# Patient Record
Sex: Male | Born: 1990 | Hispanic: Yes | Marital: Single | Smoking: Never smoker
Health system: Southern US, Community
[De-identification: ages and names within clinical notes are randomized; demographics above are authoritative.]

---
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
## 2011-04-28 ENCOUNTER — Other Ambulatory Visit (HOSPITAL_COMMUNITY): Payer: Self-pay

## 2011-04-28 ENCOUNTER — Emergency Department (HOSPITAL_COMMUNITY)
Admission: EM | Admit: 2011-04-28 | Discharge: 2011-04-28 | Disposition: A | Discharge status: Home / Self Care | Payer: Worker's Compensation | Attending: Emergency Medicine | Admitting: Emergency Medicine

## 2011-04-28 ENCOUNTER — Encounter: Payer: Self-pay | Admitting: *Deleted

## 2011-04-28 ENCOUNTER — Emergency Department (HOSPITAL_COMMUNITY): Payer: Worker's Compensation

## 2011-04-28 DIAGNOSIS — M25569 Pain in unspecified knee: Secondary | ICD-10-CM | POA: Insufficient documentation

## 2011-04-28 DIAGNOSIS — S76319A Strain of muscle, fascia and tendon of the posterior muscle group at thigh level, unspecified thigh, initial encounter: Secondary | ICD-10-CM

## 2011-04-28 DIAGNOSIS — X500XXA Overexertion from strenuous movement or load, initial encounter: Secondary | ICD-10-CM | POA: Insufficient documentation

## 2011-04-28 DIAGNOSIS — Y9269 Other specified industrial and construction area as the place of occurrence of the external cause: Secondary | ICD-10-CM | POA: Insufficient documentation

## 2011-04-28 DIAGNOSIS — IMO0002 Reserved for concepts with insufficient information to code with codable children: Secondary | ICD-10-CM | POA: Insufficient documentation

## 2011-04-28 DIAGNOSIS — Y990 Civilian activity done for income or pay: Secondary | ICD-10-CM | POA: Insufficient documentation

## 2011-04-28 MED ORDER — TRAMADOL-ACETAMINOPHEN 37.5-325 MG PO TABS: ORAL_TABLET | ORAL | Status: AC

## 2011-04-28 MED ORDER — IBUPROFEN 200 MG PO TABS
600.0000 mg | ORAL_TABLET | Freq: Once | ORAL | Status: AC
  Administered 2011-04-28: 600 mg via ORAL
  Filled 2011-04-28: qty 3

## 2011-04-28 NOTE — Discharge Instructions (Signed)
Elevate your leg. Useice packs where the pain is located.You can take ibuprofen 600 mg every 6 hours for pain. You can take it with Ultracet. Call Dr Celene Skeen office or your company's workman's comp physician to get an appointment to recheck your leg next week. Wear the knee immobilizer to stabilize your leg, you can take it off to shower.

## 2011-04-28 NOTE — ED Notes (Signed)
Pt states "I work @ [employer], was moving some tables & it hit my leg and I just felt something, I can't really move it"; pt indicates pain in right knee area

## 2011-04-28 NOTE — ED Provider Notes (Signed)
History     CSN: [id number] Arrival date & time: 04/28/2011  6:35 PM   First MD Initiated Contact with Patient 04/28/11 1857      Chief Complaint  Patient presents with  . 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 He relates they were moving tables in the kidneys right 5 and jumped back to get out of the way. He states he then started having pain behind his right knee. He states he has pain when he tries to flex his knee. He states he's walking with a limp. This happened this afternoon just prior to coming to the emergency department. He denies any prior problems with his knee. He points to the area behind his knee in the popliteal area as to where his pain is.  Primary care physician none  History reviewed. No pertinent past medical history.  History reviewed. No pertinent past surgical history.  No family history on file.  History  Substance Use Topics  . Smoking status: Never Smoker   . Smokeless tobacco: Not on file  . Alcohol Use: No   employed    Review of Systems  All other systems reviewed and are negative.    Allergies  Review of patient's allergies indicates no known allergies.  Home Medications   Current Outpatient Rx  Name Route Sig Dispense Refill  . IBUPROFEN 200 MG PO TABS Oral Take 200 mg by mouth every 6 (six) hours as needed. For headache       BP 121/72  Pulse 113  Temp(Src) 97.5 F (36.4 C) (Oral)  Resp 16  Wt 138 lb (62.596 kg)  SpO2 100%  Vital signs show tachycardia otherwise normal  Physical Exam  Nursing note and vitals reviewed. Constitutional: He is oriented to person, place, and time. He appears well-developed and well-nourished.  Non-toxic appearance. He does not appear ill. No distress.  HENT:  Head: Normocephalic and atraumatic.  Right Ear: External ear normal.  Left Ear: External ear normal.    Nose: Nose normal. No mucosal edema or rhinorrhea.  Mouth/Throat: Mucous membranes are normal. No dental abscesses or uvula swelling.  Eyes: Conjunctivae and EOM are normal. Pupils are equal, round, and reactive to light.  Neck: Full passive range of motion without pain.  Pulmonary/Chest: Effort normal. He has no rhonchi. He exhibits no crepitus.  Abdominal: Normal appearance.  Musculoskeletal: Normal range of motion. He exhibits no edema and no tenderness.       Was noted to have a very small effusion of his right knee. He is very tender to palpation at the insertion of the hamstring muscle that seems to reproduce his complaints of pain in the popliteal area. The area of the location of the Baker's cyst is nontender to palpation there is no fullness in that area. He has discomfort on flexion obesity and indicates the same area where the hamstring inserts.  Neurological: He is alert and oriented to person, place, and time. He has normal strength. No cranial nerve deficit.  Skin: Skin is warm, dry and intact. No rash noted. No erythema. No pallor.  Psychiatric: He has a normal mood and affect. His speech is normal and behavior is normal. His mood appears not anxious.    ED Course  Procedures (including critical care time)  Pt placed in knee immobilizer and given ibuprofen for pain.   Dg Knee Complete 4 Views Right  04/28/2011  *RADIOLOGY REPORT*  Clinical  Data: Twisted knee  RIGHT KNEE - COMPLETE 4+ VIEW  Comparison: None  Findings: There is no evidence of fracture or dislocation.  There is no evidence of arthropathy or other focal bone abnormality. Soft tissues are unremarkable.  IMPRESSION: No acute findings.  Original Report Authenticated By: Rosealee Albee, M.D.    Diagnoses that have been ruled out:  Diagnoses that are still under consideration:  Final diagnoses:  Hamstring strain    New Prescriptions   TRAMADOL-ACETAMINOPHEN (ULTRACET) 37.5-325 MG PER TABLET    2 tabs po QID  prn pain   Plan discharge with ortho referral  Devoria Albe, MD, Armando Gang   MDM          Ward Givens, MD 04/28/11 2145

## 2011-04-28 NOTE — ED Notes (Signed)
Knee immobilizer applied by ortho tech 

## 2014-01-21 ENCOUNTER — Ambulatory Visit (INDEPENDENT_AMBULATORY_CARE_PROVIDER_SITE_OTHER): Payer: No Typology Code available for payment source | Admitting: Family Medicine

## 2014-01-21 VITALS — BP 128/78 | HR 86 | Temp 98.0°F | Resp 16 | Ht 66.5 in | Wt 160.0 lb

## 2014-01-21 DIAGNOSIS — J029 Acute pharyngitis, unspecified: Secondary | ICD-10-CM

## 2014-01-21 DIAGNOSIS — R112 Nausea with vomiting, unspecified: Secondary | ICD-10-CM

## 2014-01-21 DIAGNOSIS — R509 Fever, unspecified: Secondary | ICD-10-CM

## 2014-01-21 LAB — POCT CBC
Granulocyte percent: 70.7 %G (ref 37–80)
HCT, POC: 46.1 % (ref 43.5–53.7)
Hemoglobin: 15.4 g/dL (ref 14.1–18.1)
Lymph, poc: 1.6 (ref 0.6–3.4)
MCH, POC: 28.8 pg (ref 27–31.2)
MCHC: 33.4 g/dL (ref 31.8–35.4)
MCV: 86.3 fL (ref 80–97)
MID (CBC): 0.6 (ref 0–0.9)
MPV: 8.5 fL (ref 0–99.8)
POC Granulocyte: 5.4 (ref 2–6.9)
POC LYMPH PERCENT: 21.2 %L (ref 10–50)
POC MID %: 8.1 %M (ref 0–12)
Platelet Count, POC: 193 10*3/uL (ref 142–424)
RBC: 5.34 M/uL (ref 4.69–6.13)
RDW, POC: 12.9 %
WBC: 7.7 10*3/uL (ref 4.6–10.2)

## 2014-01-21 LAB — POCT RAPID STREP A (OFFICE): RAPID STREP A SCREEN: NEGATIVE

## 2014-01-21 MED ORDER — ONDANSETRON HCL 8 MG PO TABS: 8.0000 mg | ORAL_TABLET | Freq: Three times a day (TID) | ORAL | Status: DC | PRN

## 2014-01-21 NOTE — Progress Notes (Signed)
[facility] [address], [place] [address] [phone]  Date:  01/21/2014   Name:  Ricky Gonzales   DOB:  11-24-1990   MRN:  [id number]  PCP:  No PCP Per Patient    Chief Complaint: Headache and Nasal Congestion   History of Present Illness:  Ricky Gonzales is a 23 y.o. very pleasant male patient who presents with the following:  He has noted a HA for a couple of days.  Last night he noted a worse headache, temp of 101 and vomiting.  He also notes a ST which is a bit better now compared with yesterday.  He also has a dry cough.  He has noted a runny nose and sneezing.   He vomited 4x, no diarrhea.  He is nauseated now but no vomiting today.  He can feel his abdomen "bubbling" but does not have abdominal pain  He ate a little fruit and drank fluids today with success.     He is generally in good healthy.    He last took advil last night.  None for over 12 hours now  There are no active problems to display for this patient.   History reviewed. No pertinent past medical history.  History reviewed. No pertinent past surgical history.  History  Substance Use Topics  . Smoking status: Never Smoker   . Smokeless tobacco: Not on file  . Alcohol Use: Yes     Comment: socially    Family History  Problem Relation Age of Onset  . Diabetes Maternal Uncle   . Diabetes Maternal Grandmother     No Known Allergies  Medication list has been reviewed and updated.  Current Outpatient Prescriptions on File Prior to Visit  Medication Sig Dispense Refill  . ibuprofen (ADVIL,MOTRIN) 200 MG tablet Take 200 mg by mouth every 6 (six) hours as needed. For headache        No current facility-administered medications on file prior to visit.    Review of Systems:  As per HPI- otherwise negative.   Physical Examination: Filed Vitals:   01/21/14 1224  BP: 128/78  Pulse: 86  Temp: 98 F (36.7 C)  Resp: 16   Filed Vitals:   01/21/14 1224   Height: 5' 6.5" (1.689 m)  Weight: 160 lb (72.576 kg)   Body mass index is 25.44 kg/(m^2). Ideal Body Weight: Weight in (lb) to have BMI = 25: 156.9  GEN: WDWN, NAD, Non-toxic, A & O x 3, looks well HEENT: Atraumatic, Normocephalic. Neck supple. No masses, No LAD.  Bilateral TM wnl, oropharynx normal.  PEERL,EOMI.   Ears and Nose: No external deformity. CV: RRR, No M/G/R. No JVD. No thrill. No extra heart sounds. PULM: CTA B, no wheezes, crackles, rhonchi. No retractions. No resp. distress. No accessory muscle use. ABD: S, NT, ND, +BS. No rebound. No HSM. EXTR: No c/c/e NEURO Normal gait.  PSYCH: Normally interactive. Conversant. Not depressed or anxious appearing.  Calm demeanor.   Results for orders placed in visit on 01/21/14  POCT RAPID STREP A (OFFICE)      Result Value Ref Range   Rapid Strep A Screen Negative  Negative  POCT CBC      Result Value Ref Range   WBC 7.7  4.6 - 10.2 K/uL   Lymph, poc 1.6  0.6 - 3.4   POC LYMPH PERCENT 21.2  10 - 50 %L   MID (cbc) 0.6  0 - 0.9   POC  MID % 8.1  0 - 12 %M   POC Granulocyte 5.4  2 - 6.9   Granulocyte percent 70.7  37 - 80 %G   RBC 5.34  4.69 - 6.13 M/uL   Hemoglobin 15.4  14.1 - 18.1 g/dL   HCT, POC 16.1  09.6 - 53.7 %   MCV 86.3  80 - 97 fL   MCH, POC 28.8  27 - 31.2 pg   MCHC 33.4  31.8 - 35.4 g/dL   RDW, POC 04.5     Platelet Count, POC 193  142 - 424 K/uL   MPV 8.5  0 - 99.8 fL    Assessment and Plan: Acute pharyngitis, unspecified pharyngitis type - Plan: POCT rapid strep A, POCT CBC, Culture, Group A Strep  Fever, unspecified - Plan: POCT rapid strep A, Culture, Group A Strep  Non-intractable vomiting with nausea, vomiting of unspecified type - Plan: POCT rapid strep A, ondansetron (ZOFRAN) 8 MG tablet  Likely viral illness.  He seems to be getting better and no longer has a fever.  Will treat with zofran as needed for GI symptoms, close follow-up if not better  Signed Ricky Amsterdam, MD

## 2014-01-21 NOTE — Patient Instructions (Signed)
It appears that you have a viral illness.  Use the zofran as needed for nausea, and advil for fever.  Let me know if you do not feel better in the next 1-2 days- Sooner if worse.   Rest and drink plenty of fluids, eat bland foods like bananas and toast until you are better.

## 2014-01-23 LAB — CULTURE, GROUP A STREP: ORGANISM ID, BACTERIA: NORMAL

## 2014-08-11 ENCOUNTER — Emergency Department (HOSPITAL_COMMUNITY)
Admission: EM | Admit: 2014-08-11 | Discharge: 2014-08-12 | Disposition: A | Discharge status: Home / Self Care | Payer: Worker's Compensation | Attending: Emergency Medicine | Admitting: Emergency Medicine

## 2014-08-11 ENCOUNTER — Encounter (HOSPITAL_COMMUNITY): Payer: Self-pay

## 2014-08-11 DIAGNOSIS — B349 Viral infection, unspecified: Secondary | ICD-10-CM | POA: Insufficient documentation

## 2014-08-11 DIAGNOSIS — K226 Gastro-esophageal laceration-hemorrhage syndrome: Secondary | ICD-10-CM | POA: Insufficient documentation

## 2014-08-11 LAB — RAPID STREP SCREEN (MED CTR MEBANE ONLY): Streptococcus, Group A Screen (Direct): NEGATIVE

## 2014-08-11 MED ORDER — RANITIDINE HCL 300 MG PO TABS: 300.0000 mg | ORAL_TABLET | Freq: Every day | ORAL | Status: AC

## 2014-08-11 NOTE — ED Notes (Signed)
Patient reports having a headache x 3 days, along with a sore throat.  He reports 3 episodes of emesis over the past three days as well.  Today he reports he felt a warm sensation in his throat and discovered is was blood.  He says he felt really dizzy prior to that episode.

## 2014-08-11 NOTE — ED Notes (Signed)
Pt states vomited once yesterday and twice today, states after vomiting today started spitting up blood, pt spitting blood in sink, small amount. States throat is sore.

## 2014-08-11 NOTE — Discharge Instructions (Signed)
Mallory-Weiss Syndrome °Mallory-Weiss syndrome refers to bleeding from tears in the lining of the esophagus near where it meets the stomach. This is often caused by forceful vomiting, retching or coughing. This condition is often associated with alcoholism. Usually the bleeding stops by itself after 24 to 48 hours. Sometimes endoscopic or surgical treatment is needed. This condition is not usually fatal. °SYMPTOMS °· Vomiting of bright red or black coffee ground like material. °· Black, tarry stools. °· Low blood pressure causing you to feel faint or experience loss of consciousness. °DIAGNOSIS  °Definitive diagnosis is by endoscopy. Treatment is usually supportive. Persistent bleeding is uncommon. Sometimes cauterization or injection of epinephrine to stop the bleeding is used during the diagnostic endoscopy. Embolization (obstruction) of the arteries supplying the area of bleeding is sometimes used to stop the bleeding. °· An NG tube (naso-gastric tube) may be inserted to determine where the bleeding is coming from. °· Often an EGD (esophagogastroduodenoscopy) is done. In this procedure there is a small flexible tube-like telescope (endoscope) put into your mouth, through your esophagus (the food tube leading from your mouth to your stomach), down into your stomach and into the small bowel. Through this your caregiver can see what and where the problem is. °TREATMENT  °It is necessary to stop the bleeding as soon as possible. During the EGD, your caregiver may inject medication into bleeding vessels to clot them.  °SEEK IMMEDIATE MEDICAL CARE IF: °· You have persistent dizziness, lightheadedness, or fainting. °· Your vomiting returns and you have blood in your stools. °· You have vomit that is bright red blood or black coffee ground-like blood, bright red blood in the stool or black tarry stools. °· You have chest pain. °· You cannot eat or drink. °· You have nausea or vomiting. °MAKE SURE YOU:  °· Understand  these instructions. °· Will watch your condition. °· Will get help right away if you are not doing well or get worse. °Document Released: 09/18/2005 Document Revised: 07/24/2011 Document Reviewed: 06/25/2013 °[employer]® Patient Information ©2015 [employer]. This information is not intended to replace advice given to you by your health care provider. Make sure you discuss any questions you have with your health care provider. ° °

## 2014-08-11 NOTE — ED Provider Notes (Signed)
CSN: [id number]     Arrival date & time 08/11/14  2032 History   First MD Initiated Contact with Patient 08/11/14 2250     Chief Complaint  Patient presents with  . Hemoptysis     (Consider location/radiation/quality/duration/timing/severity/associated sxs/prior Treatment) HPI Comments: 2 days of intermittent headaches, subjective fevers, chills, sore throat, today with 1 days.  He is describing as hemoptysis.  He states he vomited twice this morning and the third time.  States that it was bright/dark red blood, about a half a cup in amount about 8 PM.  He also reports that he did the same thing in the department and that it was seen by the nurse, per the nurse.  He's had subjective fevers and chills.  He has not had abdominal pain is no history of peptic ulcer disease.  He smokes.  He denies over-the-counter medication use.  He does use alcohol occasionally.   History reviewed. No pertinent past medical history. History reviewed. No pertinent past surgical history. Family History  Problem Relation Age of Onset  . Diabetes Maternal Uncle   . Diabetes Maternal Grandmother    History  Substance Use Topics  . Smoking status: Never Smoker   . Smokeless tobacco: Not on file  . Alcohol Use: Yes     Comment: socially    Review of Systems  Constitutional: Positive for fever and chills. Negative for activity change, appetite change and fatigue.  HENT: Positive for sore throat. Negative for congestion, facial swelling, rhinorrhea and trouble swallowing.   Eyes: Negative for photophobia and pain.  Respiratory: Negative for cough, chest tightness and shortness of breath.   Cardiovascular: Negative for chest pain and leg swelling.  Gastrointestinal: Positive for nausea and vomiting. Negative for abdominal pain, diarrhea and constipation.       Hematemesis  Endocrine: Negative for polydipsia and polyuria.  Genitourinary: Negative for dysuria, urgency, decreased urine volume and difficulty  urinating.  Musculoskeletal: Negative for back pain and gait problem.  Skin: Negative for color change, rash and wound.  Allergic/Immunologic: Negative for immunocompromised state.  Neurological: Positive for numbness. Negative for dizziness, facial asymmetry, speech difficulty, weakness and headaches.  Psychiatric/Behavioral: Negative for confusion, decreased concentration and agitation.      Allergies  Review of patient's allergies indicates no known allergies.  Home Medications   Prior to Admission medications   Medication Sig Start Date End Date Taking? Authorizing Provider  ondansetron (ZOFRAN) 8 MG tablet Take 1 tablet (8 mg total) by mouth every 8 (eight) hours as needed for nausea or vomiting. Patient not taking: Reported on 08/11/2014 01/21/14   Pearline CablesJessica C Copland, MD  ranitidine (ZANTAC) 300 MG tablet Take 1 tablet (300 mg total) by mouth at bedtime. 08/11/14   Toy CookeyMegan Docherty, MD   BP 133/68 mmHg  Pulse 81  Temp(Src) 97.8 F (36.6 C) (Oral)  Resp 16  SpO2 99% Physical Exam  Constitutional: He is oriented to person, place, and time. He appears well-developed and well-nourished. No distress.  HENT:  Head: Normocephalic and atraumatic.  Mouth/Throat: No oropharyngeal exudate.  Eyes: Pupils are equal, round, and reactive to light.  Neck: Normal range of motion. Neck supple.  Cardiovascular: Normal rate, regular rhythm and normal heart sounds.  Exam reveals no gallop and no friction rub.   No murmur heard. Pulmonary/Chest: Effort normal and breath sounds normal. No respiratory distress. He has no wheezes. He has no rales.  Abdominal: Soft. Bowel sounds are normal. He exhibits no distension and no mass. There  is no tenderness. There is no rebound and no guarding.  Musculoskeletal: Normal range of motion. He exhibits no edema or tenderness.  Neurological: He is alert and oriented to person, place, and time.  Skin: Skin is warm and dry.  Psychiatric: He has a normal mood and  affect.    ED Course  Procedures (including critical care time) Labs Review Labs Reviewed  RAPID STREP SCREEN    Imaging Review No results found.   EKG Interpretation None      MDM   Final diagnoses:  Acute viral syndrome  Mallory-Weiss tear    Pt is a 24 y.o. male with Pmhx as above who presents with about 2 days of intermittent headaches, subjective fevers, chills, sore throat, today with 1 days.  He is describing as hemoptysis.  He states he vomited twice this morning and the third time.  States that it was bright/dark red blood, about a half a cup in amount about 8 PM.  He also reports that he did the same thing in the department and that it was seen by the nurse, per the nurse.  Patient coughed up some phlegm that was mostly white and had small streak of blood in it.  Physical exam vitals stable.  Patient is in no acute distress.  Current pulmonary exam and abdominal exam is benign.  He denies recent heavy alcohol or NSAID use.  He has not had recent abdominal pain.  He has no history of peptic ulcer disease.  Rapid strep ordered from triage negative.  I think patient likely has a very small volume of blood tinged sputum from viral infection versus small Mallory-Weiss tear.  Feel he is safe to be discharged home with instructions for supportive care, prescription for Zantac also given, as he does report smoking and occasionally drinking alcohol.     Ricky Gonzales Ardyth Harps evaluation in the Emergency Department is complete. It has been determined that no acute conditions requiring further emergency intervention are present at this time. The patient/guardian have been advised of the diagnosis and plan. We have discussed signs and symptoms that warrant return to the ED, such as changes or worsening in symptoms, SOB, ab pain, chest pain, large volume hemotysis/hematemesis.       Toy Cookey, MD 08/12/14 [phone]

## 2014-08-15 LAB — CULTURE, GROUP A STREP: Strep A Culture: NEGATIVE

## 2014-11-11 ENCOUNTER — Ambulatory Visit: Payer: Worker's Compensation | Attending: Family Medicine

## 2015-04-29 ENCOUNTER — Encounter (HOSPITAL_COMMUNITY): Payer: Self-pay | Admitting: *Deleted

## 2015-04-29 ENCOUNTER — Emergency Department (INDEPENDENT_AMBULATORY_CARE_PROVIDER_SITE_OTHER)
Admission: EM | Admit: 2015-04-29 | Discharge: 2015-04-29 | Disposition: A | Discharge status: Home / Self Care | Payer: Self-pay | Source: Home / Self Care | Attending: Emergency Medicine | Admitting: Emergency Medicine

## 2015-04-29 DIAGNOSIS — M62830 Muscle spasm of back: Secondary | ICD-10-CM

## 2015-04-29 MED ORDER — METAXALONE 800 MG PO TABS: 800.0000 mg | ORAL_TABLET | Freq: Three times a day (TID) | ORAL | Status: AC

## 2015-04-29 MED ORDER — TRAMADOL HCL 50 MG PO TABS: ORAL_TABLET | ORAL | Status: AC

## 2015-04-29 MED ORDER — IBUPROFEN 800 MG PO TABS: 800.0000 mg | ORAL_TABLET | Freq: Four times a day (QID) | ORAL | Status: DC | PRN

## 2015-04-29 NOTE — Discharge Instructions (Signed)
Take the ibuprofen on a regular basis. Tramadol for severe pain only. Restart her physical therapy exercises. The Skelaxin will help with muscle spasms. Go to the ER for the signs and symptoms we discussed.

## 2015-04-29 NOTE — ED Notes (Signed)
Pt reports 3-4 years ago he had intermittent right posterior shoulder and lower rib area spasms.  Last night the same type of pain returned  He denies recent trauma or physical exercise

## 2015-04-29 NOTE — ED Provider Notes (Signed)
HPI  SUBJECTIVE:  Ricky Gonzales is a 24 y.o. male who presents with the acute onset of intermittent, right mid thoracic posterior back, posterior right shoulder pain that he describes as "grabbing". He states that a "lump shows up" in the painful area. Describes the pain as throbbing, lasts minutes. States that his right shoulder tingles when having the pain. Symptoms are better with lying on his right side, stretching, applying heat, no aggravating factors. He has not tried anything else for this No nausea, vomiting, diaphoresis, chest pain, chest pressure or heaviness, neck pain, other radiation to the neck or arm. No coughing, wheezing, shortness of breath, hemoptysis. No trauma to the area. He lists heavy boxes at work daily. He denies any change in exercise. No rash. No arm pain, arm weakness. Similar symptoms before in the same place as a similar quality, was thought to have muscle spasms. Past medical history negative for diabetes, hypertension, hypercholesterolemia, MI, coronary artery disease, PE, cancer, pneumothorax, pneumonia, tobacco. Family history negative for early coronary artery disease, MI.   History reviewed. No pertinent past medical history.  History reviewed. No pertinent past surgical history.  Family History  Problem Relation Age of Onset  . Diabetes Maternal Uncle   . Diabetes Maternal Grandmother     Social History  Substance Use Topics  . Smoking status: Never Smoker   . Smokeless tobacco: None  . Alcohol Use: Yes     Comment: socially    No current facility-administered medications for this encounter.  Current outpatient prescriptions:  .  ibuprofen (ADVIL,MOTRIN) 800 MG tablet, Take 1 tablet (800 mg total) by mouth every 6 (six) hours as needed., Disp: 30 tablet, Rfl: 0 .  metaxalone (SKELAXIN) 800 MG tablet, Take 1 tablet (800 mg total) by mouth 3 (three) times daily., Disp: 21 tablet, Rfl: 0 .  ranitidine (ZANTAC) 300 MG tablet, Take 1 tablet  (300 mg total) by mouth at bedtime., Disp: 30 tablet, Rfl: 0 .  traMADol (ULTRAM) 50 MG tablet, 1-2 tabs po q 6 hr prn pain Maximum dose= 8 tablets per day, Disp: 20 tablet, Rfl: 0  No Known Allergies   ROS  As noted in HPI.   Physical Exam  BP 108/67 mmHg  Pulse 87  Temp(Src) 97.9 F (36.6 C) (Oral)  Resp 12  SpO2 99%  Constitutional: Well developed, well nourished, no acute distress Eyes: PERRL, EOMI, conjunctiva normal bilaterally HENT: Normocephalic, atraumatic,mucus membranes moist Respiratory: Clear to auscultation bilaterally, no rales, no wheezing, no rhonchi Cardiovascular: Normal rate and rhythm, no murmurs, no gallops, no rubs GI: Soft, nondistended, normal bowel sounds, nontender, no rebound, no guarding Back: Right trapezial muscle tenderness, right rhomboid muscular tenderness. Right midthoracic muscle spasm, tenderness. No rash, bruising. No crepitus. No bony tenderness along the entire spine. no CVAT skin: No rash, skin intact Musculoskeletal: No edema, no tenderness, no deformities shoulder exam within normal limits. Neurologic: Alert & oriented x 3, CN II-XII grossly intact, no motor deficits, sensation grossly intact Psychiatric: Speech and behavior appropriate   ED Course   Medications - No data to display  No orders of the defined types were placed in this encounter.   No results found for this or any previous visit (from the past 24 hour(s)). No results found.  ED Clinical Impression  Muscle spasm of back  ED Assessment/Plan  Presentation most consistent with musculoskeletal pain, back spasm. Doubt PE, pneumothorax, pneumonia, dissection, cardiac cause of his symptoms. Vitals are normal. Patient is low cardiovascular risk.  he became symptomatic while here, positive muscle spasm felt on exam. He has tenderness, muscle spasm in his right mid thoracic area. Neurologic exam is grossly normal. Home with ibuprofen 800 mg, Robaxin, patient to restart his  physical therapy exercises. Discussed signs and symptoms that should prompt return to the emergency room. Patient agrees with plan  Handwrote prescription. Eprescribing not working, Counsellor not working.  *This clinic note was created using Dragon dictation software. Therefore, there may be occasional mistakes despite careful proofreading.  ?  Domenick Gong, MD 04/29/15 [phone]

## 2017-04-04 ENCOUNTER — Ambulatory Visit (HOSPITAL_COMMUNITY)
Admission: EM | Admit: 2017-04-04 | Discharge: 2017-04-04 | Disposition: A | Discharge status: Home / Self Care | Payer: Worker's Compensation | Attending: Family Medicine | Admitting: Family Medicine

## 2017-04-04 ENCOUNTER — Encounter (HOSPITAL_COMMUNITY): Payer: Self-pay | Admitting: Emergency Medicine

## 2017-04-04 DIAGNOSIS — H1033 Unspecified acute conjunctivitis, bilateral: Secondary | ICD-10-CM

## 2017-04-04 MED ORDER — TOBRAMYCIN 0.3 % OP SOLN
1.0000 [drp] | Freq: Four times a day (QID) | OPHTHALMIC | 0 refills | Status: AC
Start: 2017-04-04 — End: ?

## 2017-04-04 NOTE — ED Provider Notes (Signed)
  [facility]   [id number] 04/04/17 Arrival Time: 1038  ASSESSMENT & PLAN:  1. Acute conjunctivitis of both eyes, unspecified acute conjunctivitis type     Meds ordered this encounter  Medications  . tobramycin (TOBREX) 0.3 % ophthalmic solution    Sig: Place 1 drop into the right eye every 6 (six) hours.    Dispense:  5 mL    Refill:  0   Will f/u if not improving over the next several days. Reviewed expectations re: course of current medical issues. Questions answered. Outlined signs and symptoms indicating need for more acute intervention. Patient verbalized understanding. After Visit Summary given.   SUBJECTIVE:  Ricky Gonzales is a 26 y.o. male who presents with complaint of bilateral "pink eye". Onset gradual 1-2 days ago. No recent illnesses. Does not wear contact lenses. No visual changes or eye pain reported. "Eyes just feel really gritty." No OTC treatment.  ROS: As per HPI.   OBJECTIVE:  Vitals:   04/04/17 1110  BP: 123/75  Pulse: 78  Resp: 16  Temp: 98.2 F (36.8 C)  TempSrc: Oral  SpO2: 99%    General appearance: alert; no distress Eyes: bilateral conjunctival injection/erythema with watery drainage Neck: supple Lungs: clear to auscultation bilaterally Heart: regular rate and rhythm Skin: warm and dry Psychological: alert and cooperative; normal mood and affect    No Known Allergies   Social History   Socioeconomic History  . Marital status: Single    Spouse name: Not on file  . Number of children: Not on file  . Years of education: Not on file  . Highest education level: Not on file  Social Needs  . Financial resource strain: Not on file  . Food insecurity - worry: Not on file  . Food insecurity - inability: Not on file  . Transportation needs - medical: Not on file  . Transportation needs - non-medical: Not on file  Occupational History  . Not on file  Tobacco Use  . Smoking status: Never Smoker  . Smokeless  tobacco: Never Used  Substance and Sexual Activity  . Alcohol use: Yes    Comment: socially  . Drug use: No  . Sexual activity: Not on file  Other Topics Concern  . Not on file  Social History Narrative  . Not on file   Family History  Problem Relation Age of Onset  . Diabetes Maternal Uncle   . Diabetes Maternal Dulce SellarGrandmother       Rileigh Kawashima, MD 04/04/17 [phone]

## 2017-04-04 NOTE — ED Triage Notes (Signed)
PT C/O: poss bilateral pink eye  ONSET: yest   SX INCLUDE: itching, watery, redness   DENIES: inj/trauma  TAKING MEDS: none   A&O x4... NAD... Ambulatory

## 2017-09-12 ENCOUNTER — Emergency Department (HOSPITAL_COMMUNITY)
Admission: EM | Admit: 2017-09-12 | Discharge: 2017-09-13 | Disposition: A | Discharge status: Home / Self Care | Payer: Self-pay | Attending: Emergency Medicine | Admitting: Emergency Medicine

## 2017-09-12 ENCOUNTER — Other Ambulatory Visit: Payer: Self-pay

## 2017-09-12 ENCOUNTER — Encounter (HOSPITAL_COMMUNITY): Payer: Self-pay

## 2017-09-12 ENCOUNTER — Ambulatory Visit (HOSPITAL_COMMUNITY)
Admission: EM | Admit: 2017-09-12 | Discharge: 2017-09-12 | Disposition: A | Discharge status: Home / Self Care | Payer: Self-pay

## 2017-09-12 DIAGNOSIS — R110 Nausea: Secondary | ICD-10-CM | POA: Insufficient documentation

## 2017-09-12 DIAGNOSIS — R1031 Right lower quadrant pain: Secondary | ICD-10-CM

## 2017-09-12 DIAGNOSIS — R591 Generalized enlarged lymph nodes: Secondary | ICD-10-CM

## 2017-09-12 LAB — COMPREHENSIVE METABOLIC PANEL
ALBUMIN: 4.3 g/dL (ref 3.5–5.0)
ALK PHOS: 77 U/L (ref 38–126)
ALT: 25 U/L (ref 17–63)
ANION GAP: 8 (ref 5–15)
AST: 21 U/L (ref 15–41)
BILIRUBIN TOTAL: 0.5 mg/dL (ref 0.3–1.2)
BUN: 9 mg/dL (ref 6–20)
CALCIUM: 9.5 mg/dL (ref 8.9–10.3)
CO2: 28 mmol/L (ref 22–32)
Chloride: 103 mmol/L (ref 101–111)
Creatinine, Ser: 0.8 mg/dL (ref 0.61–1.24)
GFR calc Af Amer: >60 mL/min (ref >60)
GFR calc non Af Amer: >60 mL/min (ref >60)
GLUCOSE: 96 mg/dL (ref 65–99)
Potassium: 4 mmol/L (ref 3.5–5.1)
SODIUM: 139 mmol/L (ref 135–145)
TOTAL PROTEIN: 7.3 g/dL (ref 6.5–8.1)

## 2017-09-12 LAB — CBC
HCT: 44.3 % (ref 39.0–52.0)
HEMOGLOBIN: 15.1 g/dL (ref 13.0–17.0)
MCH: 29.5 pg (ref 26.0–34.0)
MCHC: 34.1 g/dL (ref 30.0–36.0)
MCV: 86.5 fL (ref 78.0–100.0)
Platelets: 198 10*3/uL (ref 150–400)
RBC: 5.12 MIL/uL (ref 4.22–5.81)
RDW: 12.5 % (ref 11.5–15.5)
WBC: 7 10*3/uL (ref 4.0–10.5)

## 2017-09-12 LAB — URINALYSIS, ROUTINE W REFLEX MICROSCOPIC
BILIRUBIN URINE: NEGATIVE
Glucose, UA: NEGATIVE mg/dL
HGB URINE DIPSTICK: NEGATIVE
Ketones, ur: NEGATIVE mg/dL
Leukocytes, UA: NEGATIVE
Nitrite: NEGATIVE
PH: 7 (ref 5.0–8.0)
Protein, ur: NEGATIVE mg/dL
SPECIFIC GRAVITY, URINE: 1.004 — AB (ref 1.005–1.030)

## 2017-09-12 LAB — LIPASE, BLOOD: Lipase: 35 U/L (ref 11–51)

## 2017-09-12 MED ORDER — HYDROMORPHONE HCL 2 MG/ML IJ SOLN
1.0000 mg | Freq: Once | INTRAMUSCULAR | Status: DC
Start: 2017-09-12 — End: 2017-09-13

## 2017-09-12 MED ORDER — ONDANSETRON HCL 4 MG/2ML IJ SOLN: 4.0000 mg | Freq: Once | INTRAMUSCULAR | Status: DC

## 2017-09-12 NOTE — ED Provider Notes (Signed)
[facility] EMERGENCY DEPARTMENT Provider Note   CSN: [id number] Arrival date & time: 09/12/17  1811     History   Chief Complaint Chief Complaint  Patient presents with  . Abdominal Pain    HPI Ricky Gonzales is a 27 y.o. male.  Patient presents to the emergency department with a chief complaint of lower abdominal pain.  He states the pain started on Sunday.  He reports that it has gradually worsened.  He reports some associated nausea, but no vomiting.  He denies any fevers or chills.  He has not taken anything for symptoms.  He denies any radiating pain into his testicles or scrotum.Marland Kitchen  He denies any other associated symptoms.  His pain is 7 out of 10.  The history is provided by the patient. No language interpreter was used.    History reviewed. No pertinent past medical history.  There are no active problems to display for this patient.   History reviewed. No pertinent surgical history.      Home Medications    Prior to Admission medications   Medication Sig Start Date End Date Taking? Authorizing Provider  ibuprofen (ADVIL,MOTRIN) 800 MG tablet Take 1 tablet (800 mg total) by mouth every 6 (six) hours as needed. Patient not taking: Reported on 09/12/2017 04/29/15   Domenick Gong, MD  metaxalone (SKELAXIN) 800 MG tablet Take 1 tablet (800 mg total) by mouth 3 (three) times daily. Patient not taking: Reported on 09/12/2017 04/29/15   Domenick Gong, MD  ranitidine (ZANTAC) 300 MG tablet Take 1 tablet (300 mg total) by mouth at bedtime. Patient not taking: Reported on 09/12/2017 08/11/14   Toy Cookey, MD  tobramycin (TOBREX) 0.3 % ophthalmic solution Place 1 drop into the right eye every 6 (six) hours. Patient not taking: Reported on 09/12/2017 04/04/17   Mardella Layman, MD  traMADol Janean Sark) 50 MG tablet 1-2 tabs po q 6 hr prn pain Maximum dose= 8 tablets per day Patient not taking: Reported on 09/12/2017 04/29/15   Domenick Gong, MD     Family History Family History  Problem Relation Age of Onset  . Diabetes Maternal Uncle   . Diabetes Maternal Grandmother     Social History Social History   Tobacco Use  . Smoking status: Never Smoker  . Smokeless tobacco: Never Used  Substance Use Topics  . Alcohol use: Yes    Comment: socially  . Drug use: No     Allergies   Patient has no known allergies.   Review of Systems Review of Systems  All other systems reviewed and are negative.    Physical Exam Updated Vital Signs BP 118/73   Pulse (!) 57   Temp 98.1 F (36.7 C)   Resp (!) 57   Ht  (1.676 m)   Wt 73.9 kg (163 lb)   SpO2 96%   BMI 26.31 kg/m   Physical Exam  Constitutional: He is oriented to person, place, and time. He appears well-developed and well-nourished.  HENT:  Head: Normocephalic and atraumatic.  Eyes: Pupils are equal, round, and reactive to light. Conjunctivae and EOM are normal. Right eye exhibits no discharge. Left eye exhibits no discharge. No scleral icterus.  Neck: Normal range of motion. Neck supple. No JVD present.  Cardiovascular: Normal rate, regular rhythm and normal heart sounds. Exam reveals no gallop and no friction rub.  No murmur heard. Pulmonary/Chest: Effort normal and breath sounds normal. No respiratory distress. He has no wheezes. He has no  rales. He exhibits no tenderness.  Abdominal: Soft. He exhibits no distension and no mass. There is tenderness in the right lower quadrant. There is no rebound and no guarding.  Musculoskeletal: Normal range of motion. He exhibits no edema or tenderness.  Neurological: He is alert and oriented to person, place, and time.  Skin: Skin is warm and dry.  Psychiatric: He has a normal mood and affect. His behavior is normal. Judgment and thought content normal.  Nursing note and vitals reviewed.    ED Treatments / Results  Labs (all labs ordered are listed, but only abnormal results are displayed) Labs Reviewed   URINALYSIS, ROUTINE W REFLEX MICROSCOPIC - Abnormal; Notable for the following components:      Result Value   Color, Urine STRAW (*)    Specific Gravity, Urine 1.004 (*)    All other components within normal limits  LIPASE, BLOOD  COMPREHENSIVE METABOLIC PANEL  CBC    EKG None  Radiology Ct Abdomen Pelvis W Contrast  Result Date: 09/13/2017 CLINICAL DATA:  Abdominal pain EXAM: CT ABDOMEN AND PELVIS WITH CONTRAST TECHNIQUE: Multidetector CT imaging of the abdomen and pelvis was performed using the standard protocol following bolus administration of intravenous contrast. CONTRAST:  OMNIPAQUE IOHEXOL 300 MG/ML  SOLN COMPARISON:  None. FINDINGS: LOWER CHEST: No basilar pulmonary nodules or pleural effusion. No apical pericardial effusion. HEPATOBILIARY: Normal hepatic contours and density. No intra- or extrahepatic biliary dilatation. Normal gallbladder. PANCREAS: Normal parenchymal contours without ductal dilatation. No peripancreatic fluid collection. SPLEEN: Normal. ADRENALS/URINARY TRACT: --Adrenal glands: Normal. --Right kidney/ureter: No hydronephrosis, nephroureterolithiasis, perinephric stranding or solid renal mass. --Left kidney/ureter: No hydronephrosis, nephroureterolithiasis, perinephric stranding or solid renal mass. --Urinary bladder: Normal for degree of distention STOMACH/BOWEL: --Stomach/Duodenum: No hiatal hernia or other gastric abnormality. Normal duodenal course. --Small bowel: No dilatation or inflammation. --Colon: No focal abnormality. --Appendix: Normal. VASCULAR/LYMPHATIC: Normal course and caliber of the major abdominal vessels. 3-4 subcentimeter right lower quadrant lymph nodes, nonspecific. REPRODUCTIVE: Normal prostate and seminal vesicles. MUSCULOSKELETAL. No bony spinal canal stenosis or focal osseous abnormality. OTHER: None. IMPRESSION: Normal appendix.  No acute abdominal or pelvic abnormality. Electronically Signed   By: Deatra Robinson M.D.   On: 09/13/2017  00:52    Procedures Procedures (including critical care time)  Medications Ordered in ED Medications  HYDROmorphone (DILAUDID) injection 1 mg (has no administration in time range)  ondansetron (ZOFRAN) injection 4 mg (has no administration in time range)     Initial Impression / Assessment and Plan / ED Course  I have reviewed the triage vital signs and the nursing notes.  Pertinent labs & imaging results that were available during my care of the patient were reviewed by me and considered in my medical decision making (see chart for details).    Patient with gradually worsening right lower quadrant pain since /02/19 0124       Roxy Horseman, PA-C 09/13/17 0126  Ward, Layla Maw, DO 09/13/17 0140

## 2017-09-12 NOTE — ED Notes (Signed)
Pt here for RLQ abdominal pain and tenderness, per Dr. Delton See, pt needs to go to the ER. Pt agreeable to plan.

## 2017-09-12 NOTE — ED Notes (Signed)
Pt currently declining pain medicine.

## 2017-09-12 NOTE — ED Triage Notes (Signed)
Pt endorses RLQ pain since Sunday, denies n/v/d. Sent from [facility]. VSS

## 2017-09-13 ENCOUNTER — Emergency Department (HOSPITAL_COMMUNITY): Payer: Self-pay

## 2017-09-13 ENCOUNTER — Encounter (HOSPITAL_COMMUNITY): Payer: Self-pay | Admitting: Radiology

## 2017-09-13 MED ORDER — IOHEXOL 300 MG/ML  SOLN
100.0000 mL | Freq: Once | INTRAMUSCULAR | Status: AC | PRN
  Administered 2017-09-13: 100 mL via INTRAVENOUS

## 2017-09-13 MED ORDER — IBUPROFEN 800 MG PO TABS: 800.0000 mg | ORAL_TABLET | Freq: Three times a day (TID) | ORAL | 0 refills | Status: AC

## 2017-09-13 NOTE — ED Notes (Signed)
Discharge instructions and prescriptions discussed with Pt. Pt verbalized understanding. Pt stable and ambulatory.   

## 2019-01-21 IMAGING — CT CT ABD-PELV W/ CM
2 of 4 series · 15 of 46 positions shown, 17 images · IV contrast (APPLIED)
Comparison: None.

CLINICAL DATA: Abdominal pain

EXAM:
CT ABDOMEN AND PELVIS WITH CONTRAST
TECHNIQUE: Multidetector CT imaging of the abdomen and pelvis was performed
using the standard protocol following bolus administration of
intravenous contrast.
CONTRAST:  100mL OMNIPAQUE IOHEXOL 300 MG/ML  SOLN

[Series 3: abd/ pelvis 5.0 i30f 2 · axial · 0.75mm/px · z∈[+803,+1223]mm · 12 of 94 slices shown, 14 images]
[im 5/94  soft-tissue]
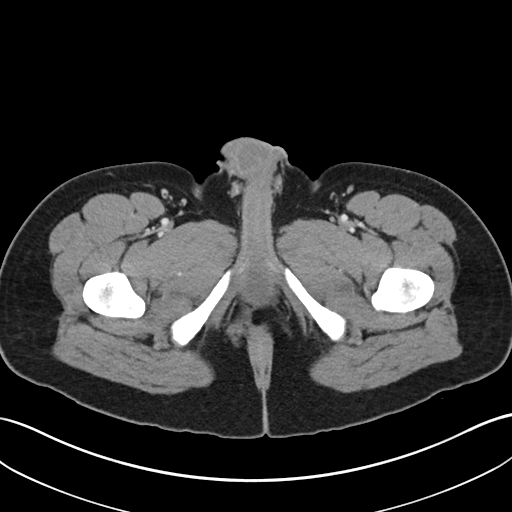
[im 5/94  bone]
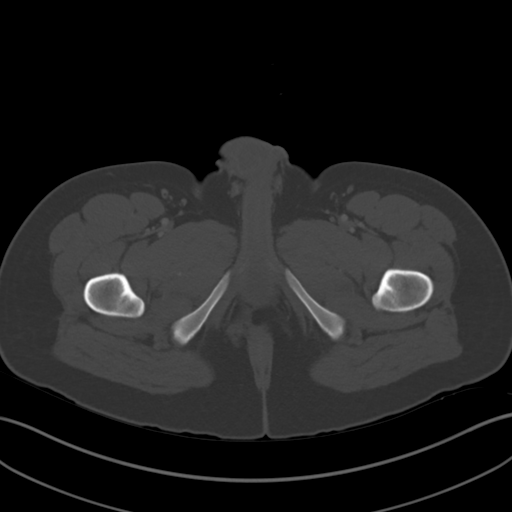
[im 13/94  soft-tissue]
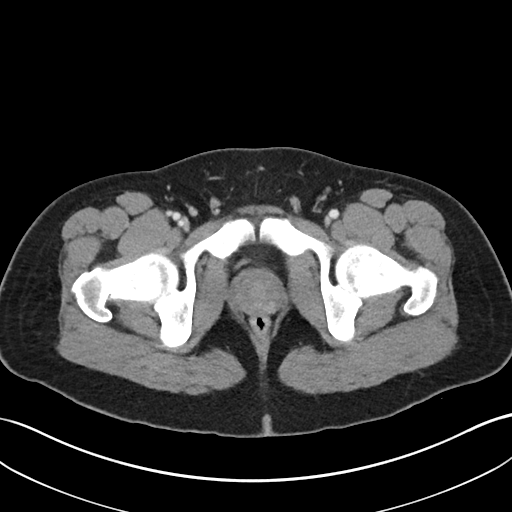
[im 22/94  soft-tissue]
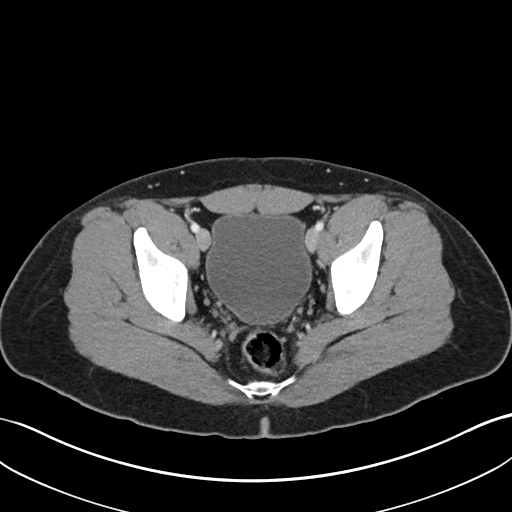
[im 30/94  soft-tissue]
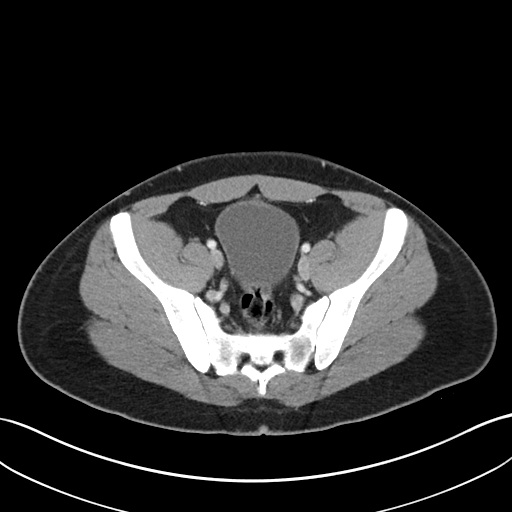
[im 34/94  soft-tissue]
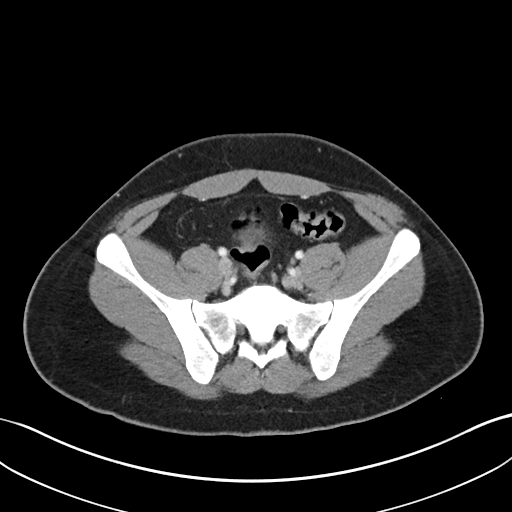
[im 43/94  soft-tissue]
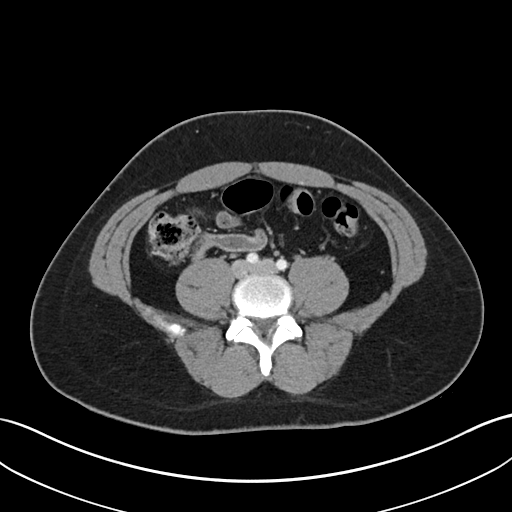
[im 51/94  soft-tissue]
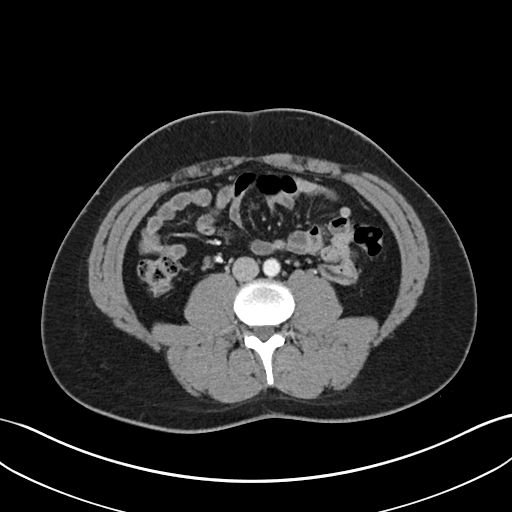
[im 60/94  soft-tissue]
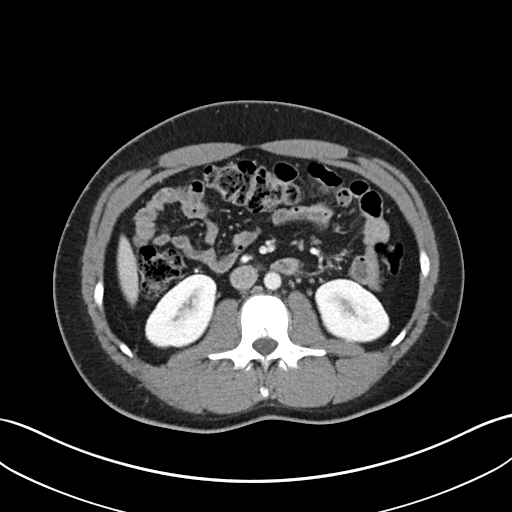
[im 64/94  soft-tissue]
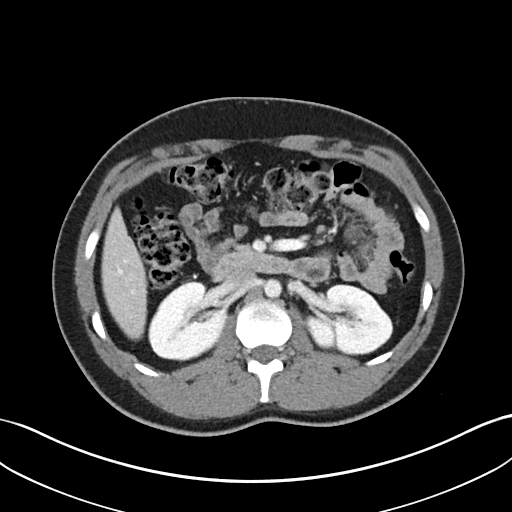
[im 64/94  bone]
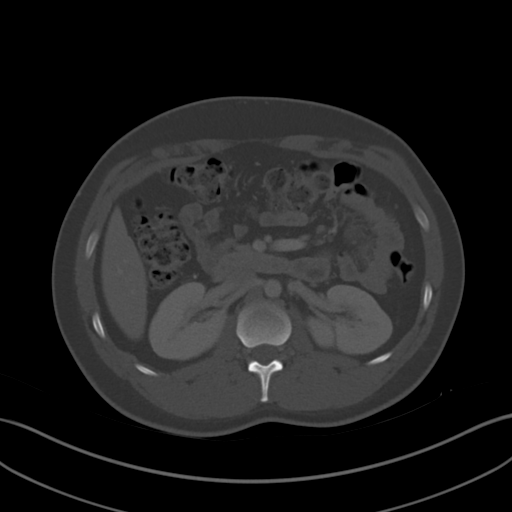
[im 72/94  soft-tissue]
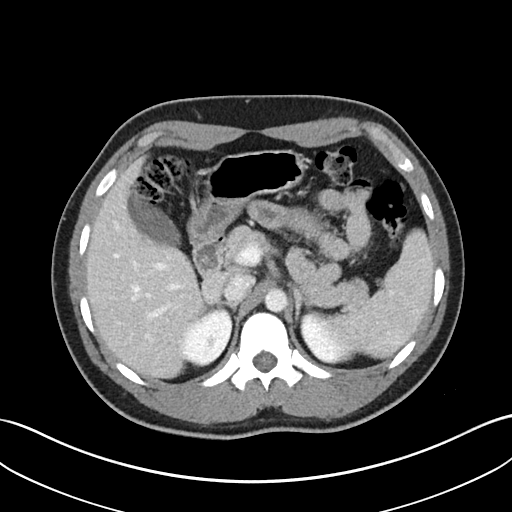
[im 81/94  soft-tissue]
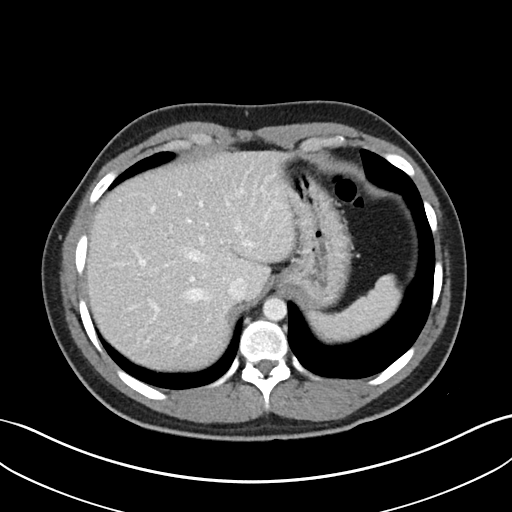
[im 89/94  soft-tissue]
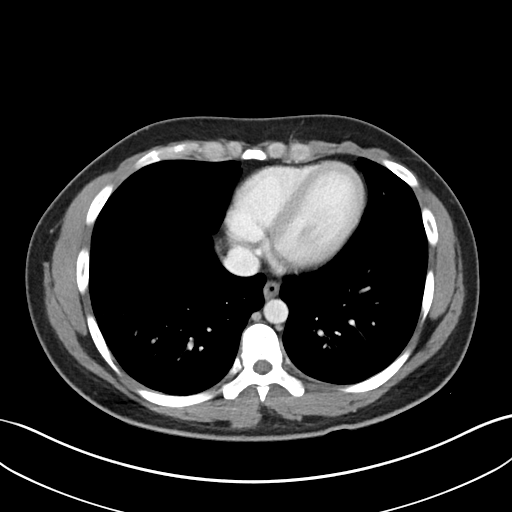

[Series 6: coronal soft tissue · coronal · 0.61mm/px · 3 of 84 slices shown]
[im 28/84  soft-tissue]
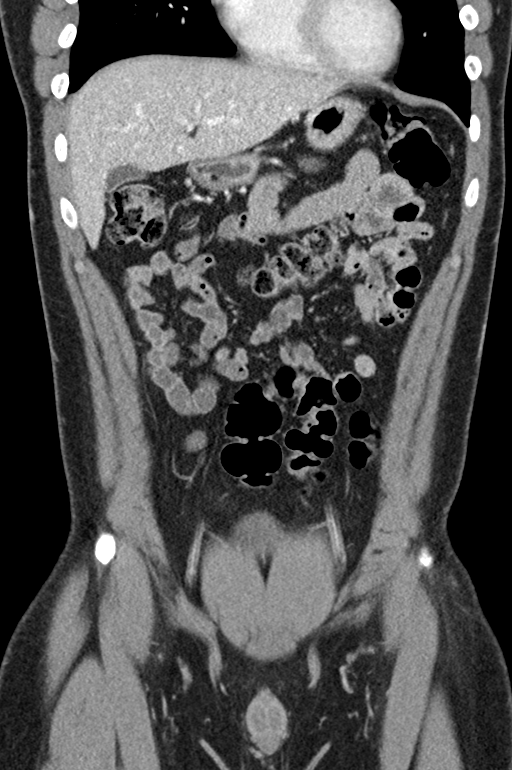
[im 37/84  soft-tissue]
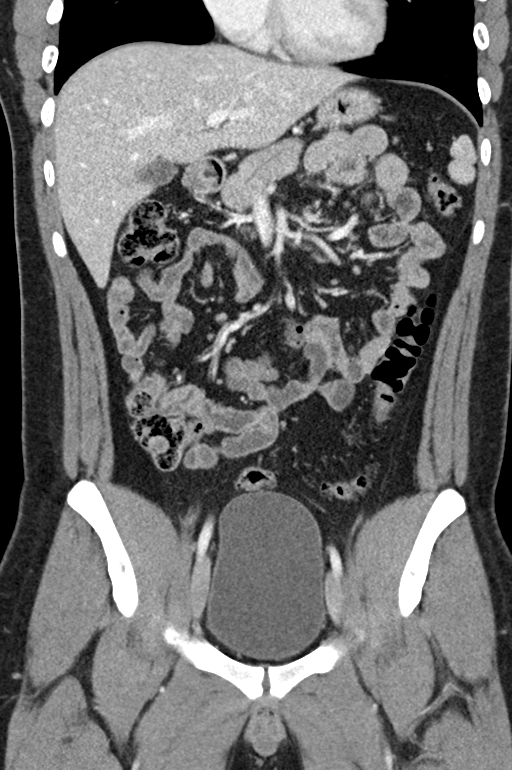
[im 47/84  soft-tissue]
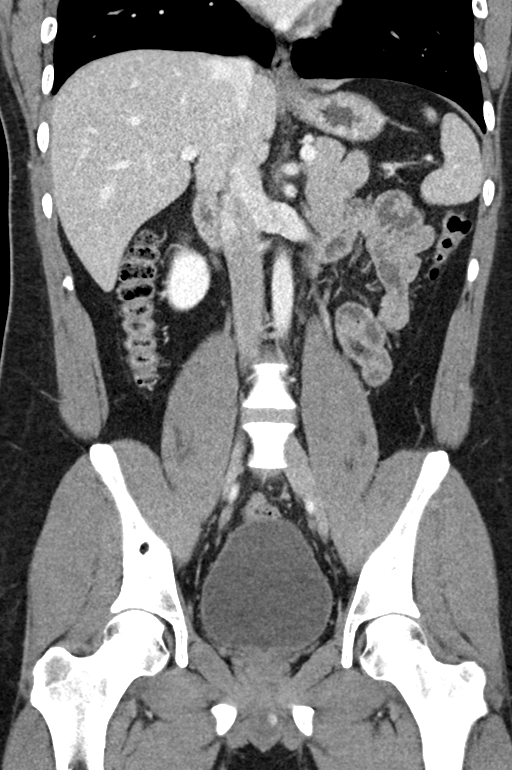

[15 of 46 positions shown; findings below may reference images not displayed]

FINDINGS: LOWER CHEST: No basilar pulmonary nodules or pleural effusion. No
apical pericardial effusion.

HEPATOBILIARY: Normal hepatic contours and density. No intra- or
extrahepatic biliary dilatation. Normal gallbladder.

PANCREAS: Normal parenchymal contours without ductal dilatation. No
peripancreatic fluid collection.

SPLEEN: Normal.

ADRENALS/URINARY TRACT:

--Adrenal glands: Normal.

--Right kidney/ureter: No hydronephrosis, nephroureterolithiasis,
perinephric stranding or solid renal mass.

--Left kidney/ureter: No hydronephrosis, nephroureterolithiasis,
perinephric stranding or solid renal mass.

--Urinary bladder: Normal for degree of distention

STOMACH/BOWEL:

--Stomach/Duodenum: No hiatal hernia or other gastric abnormality.
Normal duodenal course.

--Small bowel: No dilatation or inflammation.

--Colon: No focal abnormality.

--Appendix: Normal.

VASCULAR/LYMPHATIC: Normal course and caliber of the major abdominal
vessels. 3-4 subcentimeter right lower quadrant lymph nodes,
nonspecific.

REPRODUCTIVE: Normal prostate and seminal vesicles.

MUSCULOSKELETAL. No bony spinal canal stenosis or focal osseous
abnormality.

OTHER: None.
IMPRESSION: Normal appendix.  No acute abdominal or pelvic abnormality.
